# Patient Record
Sex: Female | Born: 1940 | Race: White | Hispanic: No
Health system: Southern US, Community
[De-identification: ages and names within clinical notes are randomized; demographics above are authoritative.]

---

## 2016-07-26 NOTE — Progress Notes (Signed)
Tawana ScaleZach Roselani Burnett D.O. [facility] [address] [place], [place] [address] Phone: [phone] Subjective:    I'm seeing this patient by the request  of:    CC: Neck pain  FAO:ZHYQMVHQIOHPI:Subjective  Nichole SeminoleFaye H Burnett is a 76 y.o. female coming in with complaint of Neck pain.atient states that this has been chronic. This is a dull, throbbing aching pain with any type of movement. Sometimes can cause a sharp pain. Mild pain that seems to go downarm but very intermittently. Patient denies any injury. Rates the severity pain is 6 out of 10. Can affect some daily activities. No longer drives secondary to pain.     No past medical history on file. No past surgical history on file. Social History   Social History  . Marital status: Divorced    Spouse name: N/A  . Number of children: N/A  . Years of education: N/A   Social History Main Topics  . Smoking status: Not on file  . Smokeless tobacco: Not on file  . Alcohol use Not on file  . Drug use: Unknown  . Sexual activity: Not on file   Other Topics Concern  . Not on file   Social History Narrative  . No narrative on file   Allergies not on file No family history on file.  Past medical history, social, surgical and family history all reviewed in electronic medical record.  No pertanent information unless stated regarding to the chief complaint.   Review of Systems:Review of systems updated and as accurate as of 07/27/16  No headache, visual changes, nausea, vomiting, diarrhea, constipation, dizziness, abdominal pain, skin rash, fevers, chills, night sweats, weight loss, swollen lymph nodes, body aches, joint swelling, muscle aches, chest pain, shortness of breath, mood changes.   Objective  Blood pressure 110/62, pulse 66, height 5\' 3"  (1.6 m), weight 140 lb (63.5 kg). Systems examined below as of 07/27/16   General: No apparent distress alert and oriented x3 mood and affect normal, dressed appropriately.  HEENT: Pupils equal,  extraocular movements intact  Respiratory: Patient's speak in full sentences and does not appear short of breath  Cardiovascular: No lower extremity edema, non tender, no erythema  Skin: Warm dry intact with no signs of infection or rash on extremities or on axial skeleton.  Abdomen: Soft nontender  Neuro: Cranial nerves II through XII are intact, neurovascularly intact in all extremities with 2+ DTRs and 2+ pulses.  Lymph: No lymphadenopathy of posterior or anterior cervical chain or axillae bilaterally.  Gait normal with good balance and coordination.  MSK:  Non tender with full range of motion and good stability and symmetric strength and tone of shoulders, elbows, wrist, hip, knee and ankles bilaterally. Mild arthritic changes of multiple joints Neck: Inspection unremarkable. No palpable stepoffs. Negative Spurling's maneuver. Moderate to severe loss of motion lacking the last 10 of extension as well as the last 10-15 of side bending bilaterally with significant crepitus Grip strength and sensation normal in bilateral hands Strength good C4 to T1 distribution No sensory change to C4 to T1 Negative Hoffman sign bilaterally Reflexes normal Severe trigger points noted multiple in the right trapezius area  After verbal consent patient was prepped with alcohol swabs and with a 25-gauge half-inch needle was injected with a total of 3 mL of 0.5% Marcaine and 1 mL of Kenalog 40 mg/dL and for specific trigger points in the right trapezius. No blood loss. Post injection instructions given. Patient had moderate improvement immediately.  Procedure  note 97110; 15 minutes spent for Therapeutic exercises as stated in above notes.  This included exercises focusing on stretching, strengthening, with significant focus on eccentric aspects.  Patient was given postural exercises, ergonomics, scapular exercises working on external rotation straightening alleviating pain on the neck. Proper technique shown  and discussed handout in great detail with ATC.  All questions were discussed and answered.     Impression and Recommendations:     This case required medical decision making of moderate complexity.      Note: This dictation was prepared with Dragon dictation along with smaller phrase technology. Any transcriptional errors that result from this process are unintentional.

## 2016-07-27 ENCOUNTER — Ambulatory Visit (INDEPENDENT_AMBULATORY_CARE_PROVIDER_SITE_OTHER)
Admission: RE | Admit: 2016-07-27 | Discharge: 2016-07-27 | Disposition: A | Discharge status: Home / Self Care | Payer: Medicare HMO | Source: Ambulatory Visit | Attending: Family Medicine | Admitting: Family Medicine

## 2016-07-27 ENCOUNTER — Ambulatory Visit (INDEPENDENT_AMBULATORY_CARE_PROVIDER_SITE_OTHER): Payer: Medicare HMO | Admitting: Family Medicine

## 2016-07-27 VITALS — BP 110/62 | HR 66 | Ht 63.0 in | Wt 140.0 lb

## 2016-07-27 DIAGNOSIS — M5030 Other cervical disc degeneration, unspecified cervical region: Secondary | ICD-10-CM | POA: Insufficient documentation

## 2016-07-27 DIAGNOSIS — M25511 Pain in right shoulder: Secondary | ICD-10-CM | POA: Diagnosis not present

## 2016-07-27 DIAGNOSIS — M542 Cervicalgia: Secondary | ICD-10-CM

## 2016-07-27 MED ORDER — GABAPENTIN 100 MG PO CAPS: 100.0000 mg | ORAL_CAPSULE | Freq: Every day | ORAL | 3 refills | Status: AC

## 2016-07-27 NOTE — Assessment & Plan Note (Signed)
Injecting previously. Discussed with patient at great length. We discussed icing regimen and home exercises. Patient responded well to the injections and hopefully will continue to improve. Gabapentin given if any radicular symptoms is contributing. Follow-up again

## 2016-07-27 NOTE — Patient Instructions (Signed)
Good to see you  Ice 20 minutes 2 times daily. Usually after activity and before bed. Exercises 3 times a week.  pennsaid pinkie amount topically 2 times daily as needed.  Keep hands within peripheral vision  Get xray downstairs Gabapentin 100-200mg  at night See me again in 3-6 weeks.

## 2016-07-27 NOTE — Assessment & Plan Note (Signed)
I believe the patient's underlying diagnosis is degenerative disc disease. Significant tightness of the muscle strength and protected. Patient will be given a vitamin D supplementation, tramadol for breakthrough pain, gabapentin given to help with nighttime pain. Topical anti-inflammatories given home exercises. We will see how patient does with conservative therapy and follow-up with me again in 4 weeks.

## 2016-09-04 ENCOUNTER — Ambulatory Visit: Payer: Medicare HMO | Admitting: Family Medicine

## 2016-09-08 ENCOUNTER — Ambulatory Visit (INDEPENDENT_AMBULATORY_CARE_PROVIDER_SITE_OTHER): Payer: Medicare HMO | Admitting: Family Medicine

## 2016-09-08 VITALS — BP 140/80 | HR 64 | Ht 63.0 in | Wt 138.0 lb

## 2016-09-08 DIAGNOSIS — M5030 Other cervical disc degeneration, unspecified cervical region: Secondary | ICD-10-CM | POA: Diagnosis not present

## 2016-09-08 NOTE — Patient Instructions (Signed)
Good to see you  We will get yo in with physical therapy  [facility] looks good and they should call you  Keep doing everything else we are doing.  In 2-3 weeks if no improvement then lets consider MRI of the neck and see if epidural would be helpful.

## 2016-09-08 NOTE — Assessment & Plan Note (Signed)
Continues to have significant difficulty. Patient does not want any more invasive procedures at this time. We'll start with formal physical therapy. We discussed icing regimen, continue the posture and ergonomics changes. Continue vitamin supplementations. Encourage her to try the gabapentin on a more regular basis. Follow-up again in 4 weeks

## 2016-09-08 NOTE — Progress Notes (Signed)
  [facility] [address] [place], [place] [address] Phone: [phone] Subjective:    CC: Neck pain f/u   FEO:[id number]  Nichole Burnett is a 76 y.o. female coming in with complaint of Neck pain.atient states that this has been chronic. Patient was seen by me previously and was diagnosed with cervical radiculopathy but we attempted trigger point injections. Did have some improvement for approximately a week and then the pain is starting to come back again. Patient states it can radiate to patient was in fractures. Pictures were and apparently visualized by me showing moderate osteophytic changes at multiple levels. Patient states continues to have an aching sensation.     No past medical history on file. No past surgical history on file. Social History   Social History  . Marital status: Divorced    Spouse name: N/A  . Number of children: N/A  . Years of education: N/A   Social History Main Topics  . Smoking status: Not on file  . Smokeless tobacco: Not on file  . Alcohol use Not on file  . Drug use: Unknown  . Sexual activity: Not on file   Other Topics Concern  . Not on file   Social History Narrative  . No narrative on file   Not on File No family history on file. No family history of autoimmune diseases  Past medical history, social, surgical and family history all reviewed in electronic medical record.  No pertanent information unless stated regarding to the chief complaint.   Review of Systems: No headache, visual changes, nausea, vomiting, diarrhea, constipation, dizziness, abdominal pain, skin rash, fevers, chills, night sweats, weight loss, swollen lymph nodes, body aches, joint swelling, chest pain, shortness of breath, mood changes. Positive muscle aches   Objective  Blood pressure 140/80, pulse 64, height 5\' 3"  (1.6 m), weight 138 lb (62.6 kg).   Systems examined below as of 09/08/16 General: NAD A&O x3 mood, affect normal    HEENT: Pupils equal, extraocular movements intact no nystagmus Respiratory: not short of breath at rest or with speaking Cardiovascular: No lower extremity edema, non tender Skin: Warm dry intact with no signs of infection or rash on extremities or on axial skeleton. Abdomen: Soft nontender, no masses Neuro: Cranial nerves  intact, neurovascularly intact in all extremities with 2+ DTRs and 2+ pulses. Lymph: No lymphadenopathy appreciated today  Gait mild antalgic.  MSK:  Non tender with full range of motion and good stability and symmetric strength and tone of shoulders, elbows, wrist, hip, knee and ankles bilaterally. Mild arthritic changes of multiple joints mild resting tremor of the right sign. Neck: Inspection mild loss of lordosis. No palpable stepoffs. Negative Spurling's maneuver. Continues to have significant limitation in range of motion lacking the last 10 of extension Grip strength and sensation normal in bilateral hands Strength good C4 to T1 distribution No sensory change to C4 to T1 Negative Hoffman sign bilaterally Reflexes normal Continued significant tightness of the right trapezius. Mild difference from previous exam      Impression and Recommendations:     This case required medical decision making of moderate complexity.      Note: This dictation was prepared with Dragon dictation along with smaller phrase technology. Any transcriptional errors that result from this process are unintentional.

## 2016-10-20 ENCOUNTER — Ambulatory Visit: Payer: Medicare HMO | Admitting: Family Medicine

## 2016-11-10 ENCOUNTER — Ambulatory Visit (INDEPENDENT_AMBULATORY_CARE_PROVIDER_SITE_OTHER): Payer: Medicare HMO | Admitting: Family Medicine

## 2016-11-10 VITALS — BP 140/80 | HR 64 | Ht 63.0 in | Wt 142.4 lb

## 2016-11-10 DIAGNOSIS — M5030 Other cervical disc degeneration, unspecified cervical region: Secondary | ICD-10-CM

## 2016-11-10 NOTE — Patient Instructions (Signed)
Good ot see you  I am sorry you are not better We will get MRI and they will call you  If I see what I expect then wee will send you for an epidural  After the epidural see me again 2-3 weeks after the injection.

## 2016-11-10 NOTE — Assessment & Plan Note (Signed)
Patient is having worsening symptoms. Failed all conservative therapy at this point. Taking the gabapentin at night with no significant benefit. Discussed with patient at this length and width failing all conservative therapy and do feel advance imaging such as an MRI of the cervical spine is necessary. Patient could be a candidate for an epidural steroid injection. Patient will be willing to do that. Do not want to do much more in pain medications with her being in fairly large all risks. Follow-up after MRI to discuss epidural.

## 2016-11-10 NOTE — Progress Notes (Signed)
  Nichole Burnett D.O. [facility] [address] [place], [place] [address] Phone: [phone] Subjective:    I'm seeing this patient by the request  of:    CC: Neck pain follow-up  OZH:YQMVHQIONGHPI:Subjective  Nichole Burnett is a 76 y.o. female coming in with complaint of neck pain. Patient was seen previously and found to have degenerative disc disease of the cervical spine. Patient had a tendon trigger point injections with very minimal improvement of the right shoulder. Had gone to formal physical therapy for 6 weeks now. Only very minimal better. Patient states that it is affecting daily activities. Waking her up at night.    No past medical history on file. No past surgical history on file. Social History   Social History  . Marital status: Divorced    Spouse name: N/A  . Number of children: N/A  . Years of education: N/A   Social History Main Topics  . Smoking status: Not on file  . Smokeless tobacco: Not on file  . Alcohol use Not on file  . Drug use: Unknown  . Sexual activity: Not on file   Other Topics Concern  . Not on file   Social History Narrative  . No narrative on file   Not on File No family history on file. No family history of autoimmune diseases   Past medical history, social, surgical and family history all reviewed in electronic medical record.  No pertanent information unless stated regarding to the chief complaint.   Review of Systems:Review of systems updated and as accurate as of 11/10/16  No  visual changes, nausea, vomiting, diarrhea, constipation, dizziness, abdominal pain, skin rash, fevers, chills, night sweats, weight loss, swollen lymph nodes, body aches, joint swelling, chest pain, shortness of breath, mood changes. Positive headache, muscle aches  Objective  Blood pressure 140/80, pulse 64, height 5\' 3"  (1.6 m), weight 142 lb 6.4 oz (64.6 kg). Systems examined below as of 11/10/16   General: No apparent distress alert and oriented x3 mood  and affect normal, dressed appropriately. Resting tremor noted. HEENT: Pupils equal, extraocular movements intact  Respiratory: Patient's speak in full sentences and does not appear short of breath  Cardiovascular: No lower extremity edema, non tender, no erythema  Skin: Warm dry intact with no signs of infection or rash on extremities or on axial skeleton.  Abdomen: Soft nontender  Neuro: Cranial nerves II through XII are intact, neurovascularly intact in all extremities with 2+ DTRs and 2+ pulses.  Lymph: No lymphadenopathy of posterior or anterior cervical chain or axillae bilaterally.  Gait normal with good balance and coordination.  MSK:  Non tender with full range of motion and good stability and symmetric strength and tone of shoulders, elbows, wrist, hip, knee and ankles bilaterally. Arthritic changes of multiple joints Neck: Inspection increasing loss of lordosis. Possible palpable step-off at C6-C7 Negative Spurling's maneuver. Significant limitation with only 10 of extension. No side bending bilaterally. Crepitus with range of motion testing Grip strength is decreased. Strength seems 4 out of 5 but symmetric in the C7 distribution Negative Hoffman sign bilaterally Reflexes normal    Impression and Recommendations:     This case required medical decision making of moderate complexity.      Note: This dictation was prepared with Dragon dictation along with smaller phrase technology. Any transcriptional errors that result from this process are unintentional.

## 2016-11-12 ENCOUNTER — Telehealth: Payer: Self-pay | Admitting: Internal Medicine

## 2016-11-12 DIAGNOSIS — M5030 Other cervical disc degeneration, unspecified cervical region: Secondary | ICD-10-CM

## 2016-11-12 NOTE — Telephone Encounter (Signed)
Pt daughter called and the Imaging center cannot be scheduled due to the Pt having a battery pack in her chest for brain simulation from a stroke, [facility] cannot do it because of this, but they state they believe [facility] can do it, also they [place] to [place], please advise can this be sent somewhere else.

## 2016-11-13 NOTE — Telephone Encounter (Signed)
Called pt's daughter back, vmail box is full, unable to leave a msg.

## 2016-11-18 NOTE — Telephone Encounter (Signed)
Order as been changed to CT & sent to [facility].

## 2016-11-24 ENCOUNTER — Ambulatory Visit (INDEPENDENT_AMBULATORY_CARE_PROVIDER_SITE_OTHER): Payer: Medicare HMO

## 2016-11-24 DIAGNOSIS — M5030 Other cervical disc degeneration, unspecified cervical region: Secondary | ICD-10-CM

## 2016-11-24 DIAGNOSIS — M47892 Other spondylosis, cervical region: Secondary | ICD-10-CM

## 2016-11-25 ENCOUNTER — Telehealth: Payer: Self-pay

## 2016-11-25 DIAGNOSIS — M542 Cervicalgia: Secondary | ICD-10-CM

## 2016-11-25 NOTE — Telephone Encounter (Signed)
-----   Message from Zachary M Smith, DO sent at 11/25/2016  2:39 PM EST ----- Can we call patient, tell her arthritis of the neck making the symptoms.  If she wants epidural could help and we can order it  Once she knows when the epidural then see us again 2 weeks after the injection.   

## 2016-11-26 NOTE — Telephone Encounter (Signed)
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  If she wants epidural could help and we can order it  Once she knows when the epidural then see us again 2 weeks after the injection.

## 2018-12-22 IMAGING — DX DG CERVICAL SPINE COMPLETE 4+V
5 series · 5 of 5 positions shown · non-contrast
Comparison: None.

CLINICAL DATA: Chronic right-sided neck and shoulder pain for 1
year, no injury

EXAM:
CERVICAL SPINE - COMPLETE 4+ VIEW

[c-spine lat]
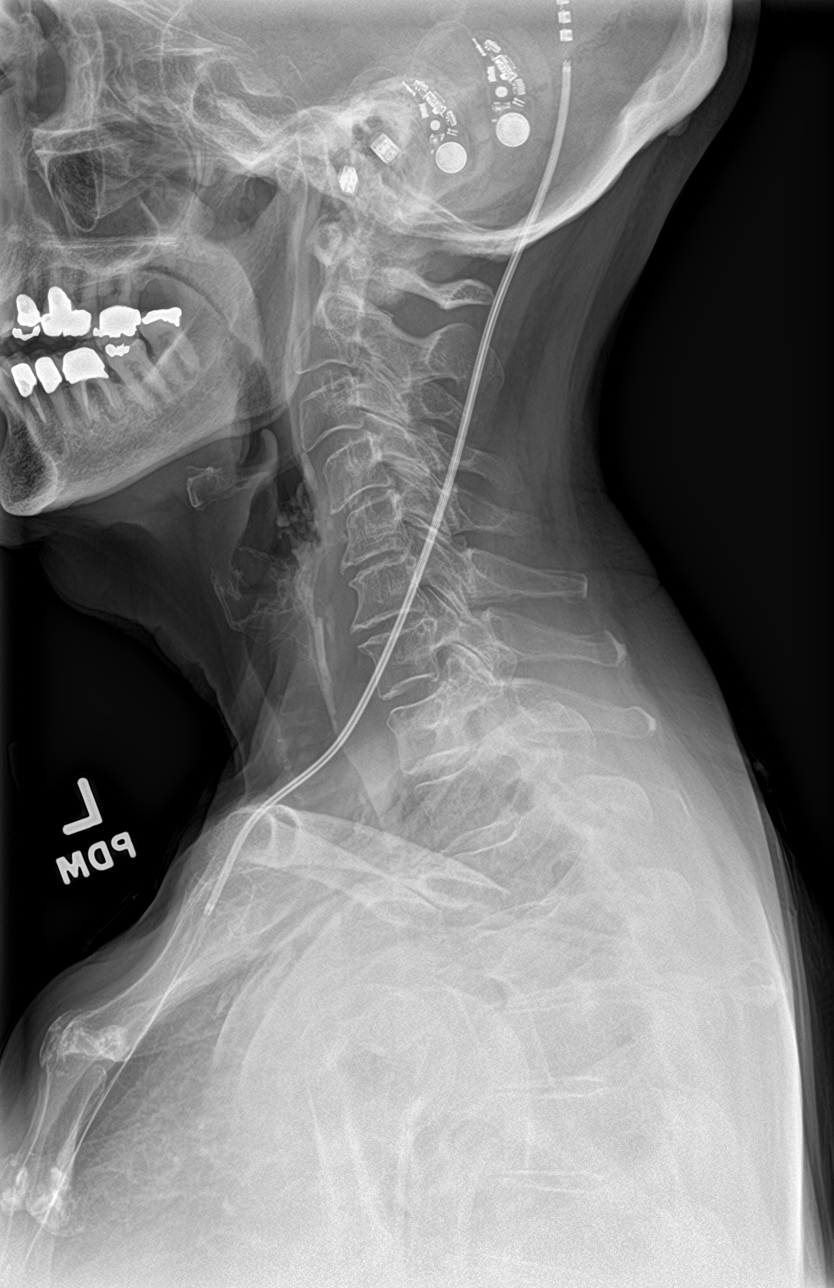

[c-spine obl (1 of 2)]
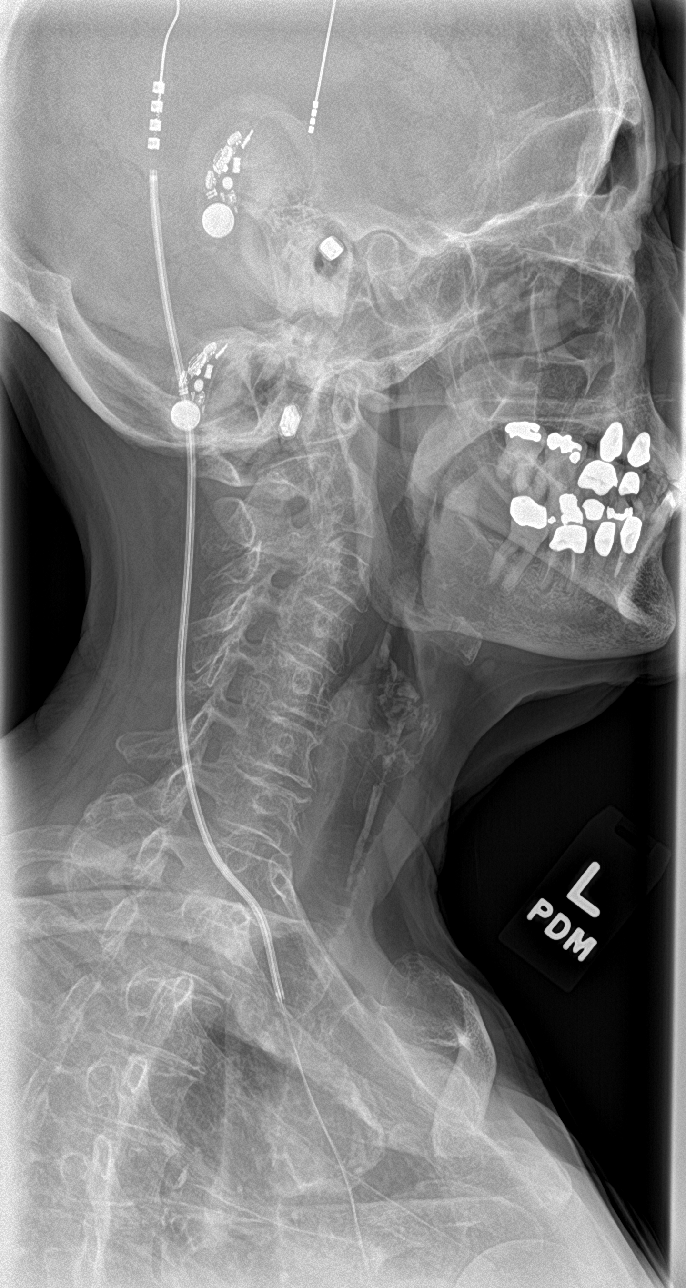

[c-spine obl (2 of 2)]
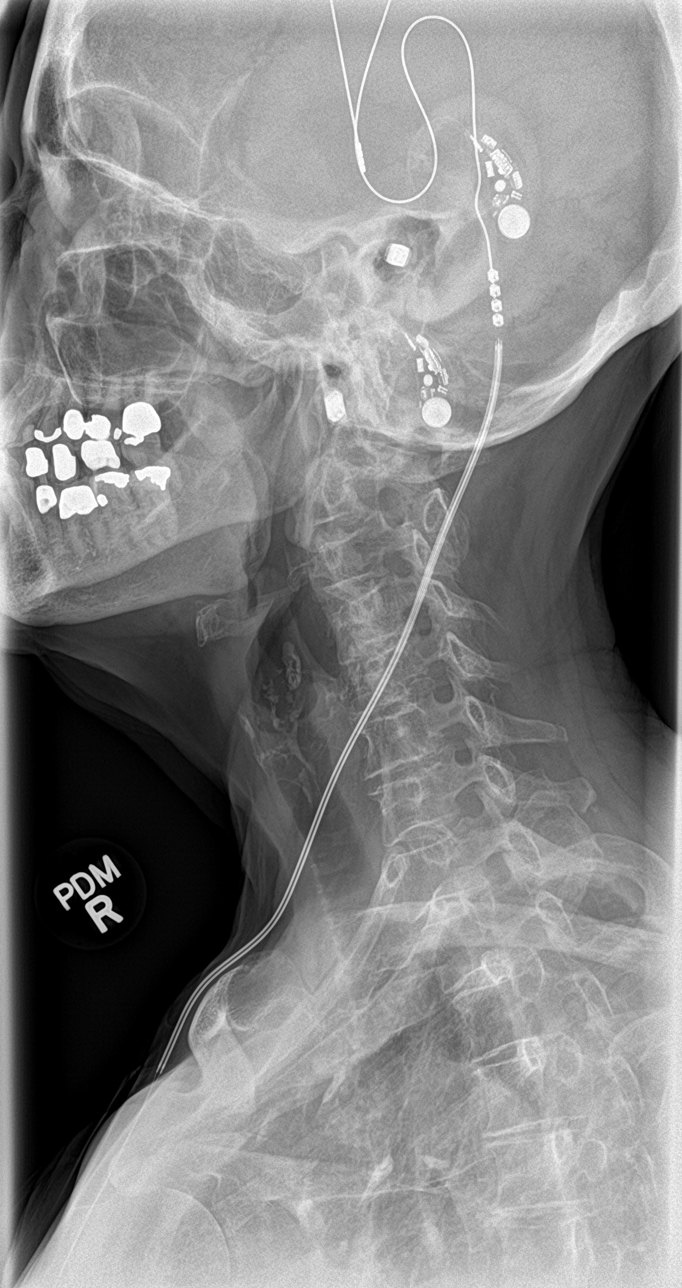

[c-spine ap]
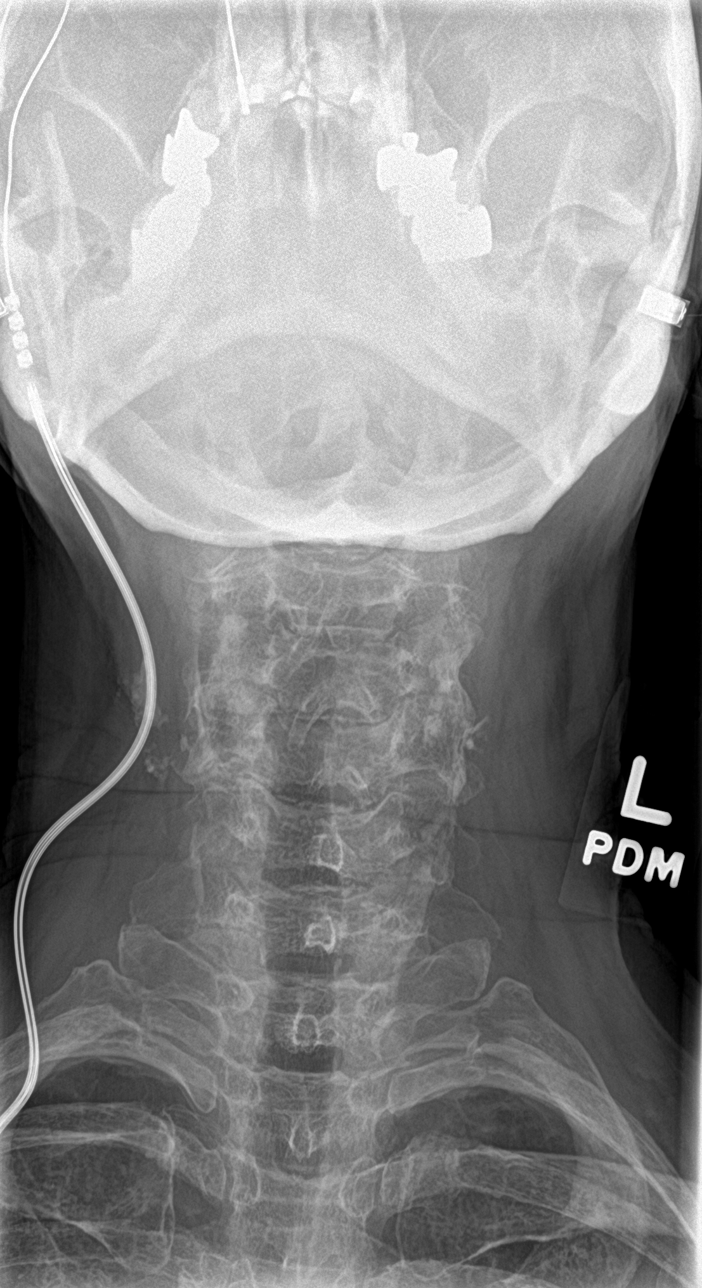

[c-spine open mouth]
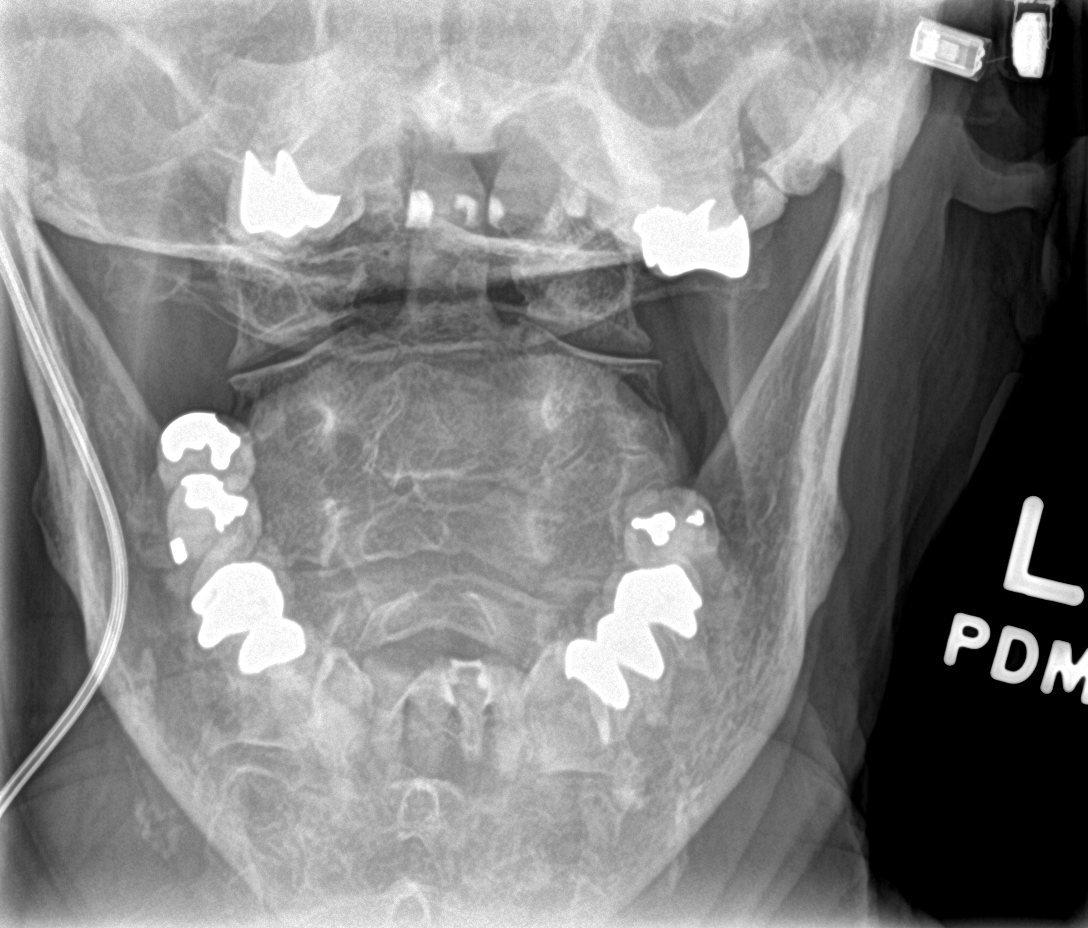

[5 of 5 positions shown; findings below may reference images not displayed]

FINDINGS: The cervical vertebrae are straightened in alignment. There is
degenerative disc disease at C4-5, C5-6, and to lesser degree C6-7
levels. There is loss of disc space and sclerosis with spurring at
these levels. No prevertebral soft tissue swelling is seen. On
oblique views, there is some foraminal narrowing at C5-6 and C6-7.
The remainder of the foramina are patent. The odontoid process is
intact. The lung apices are clear.
IMPRESSION: 1. Straightened alignment.
2. Degenerative disc disease at C5-6 and C6-7 with some foraminal
narrowing at these levels.
